# Patient Record
Sex: Male | Born: 1980 | Race: White | Hispanic: No | Marital: Single
Health system: Southern US, Community
[De-identification: ages and names within clinical notes are randomized; demographics above are authoritative.]

---

## 2003-12-06 ENCOUNTER — Ambulatory Visit (HOSPITAL_COMMUNITY): Admission: RE | Admit: 2003-12-06 | Discharge: 2003-12-06 | Payer: Self-pay | Admitting: Family Medicine

## 2005-06-03 IMAGING — US US SCROTUM
1 series · 14 of 25 positions shown · non-contrast
Comparison: none

CLINICAL DATA: Testicular pain intermittent. 
 TESTICULAR ULTRASOUND
 Testes are symmetric and normal in size, right measuring 5.3 x 2.5 x 3.8 cm and left measuring 5.2 x 2.3 x 3.3 cm.  No evidence of testicular mass.  Symmetric intratesticular blood flow on color Doppler imaging.  A very small right hydrocele is noted.   No evidence paratesticular mass.  No hernias identified.  No evidence of varicocele. 
 IMPRESSION
 Very small right hydrocele.  Otherwise, normal exam.
 ARTERIAL AND VENOUS DOPPLER OF THE SCROTUM BILATERALLY
 Color Doppler and pulsed-wave evaluation of the arterial and venous systems of the scrotum performed.  Normal arterial and venous waveforms are seen in each hemiscrotum.  Specifically, symmetric arterial blood flow is identified with no evidence of torsion or hyperemia.  
 Normal arterial and venous ultrasound of the testes/scrotum.

[Series 1: unknown · 0.09mm/px · 14 of 46 slices shown]
[im 1/46]
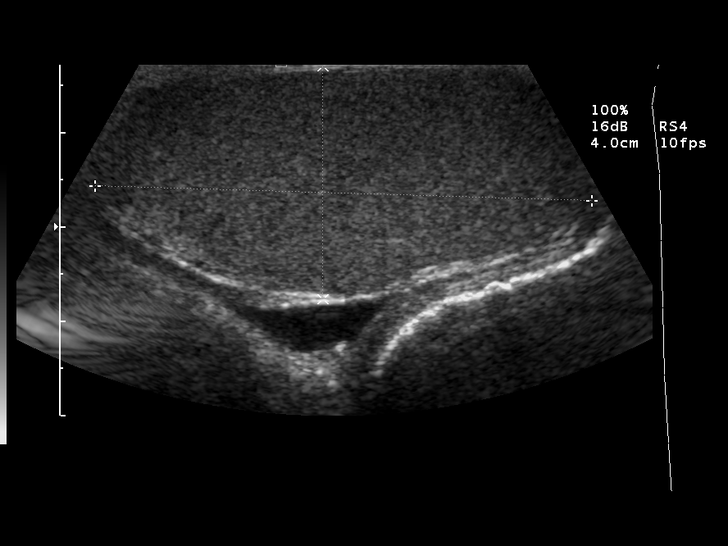
[im 4/46]
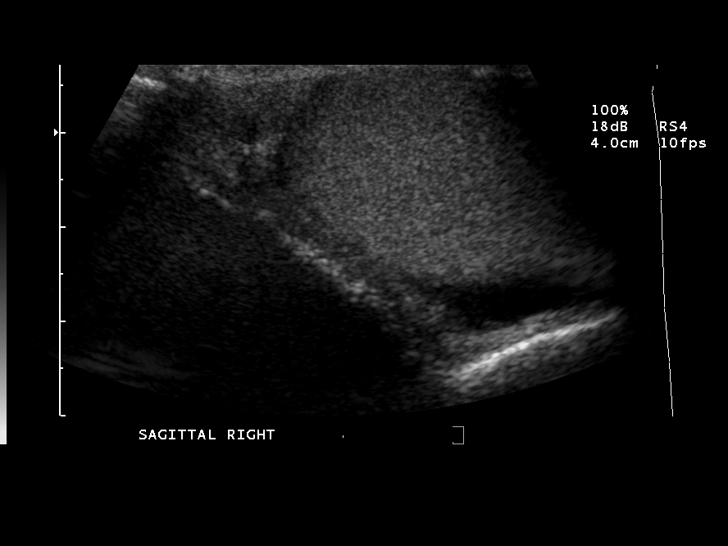
[im 8/46]
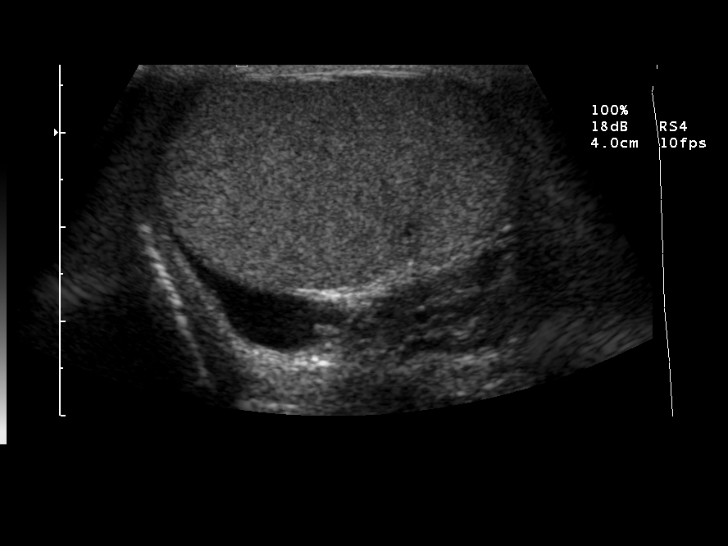
[im 12/46]
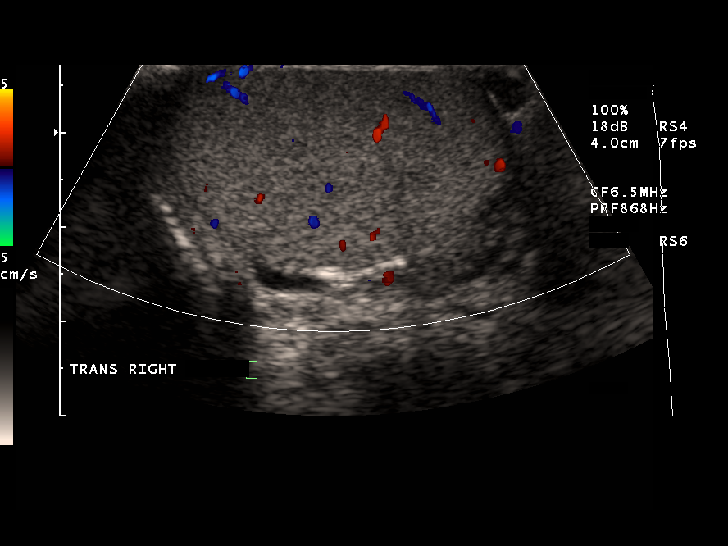
[im 16/46]
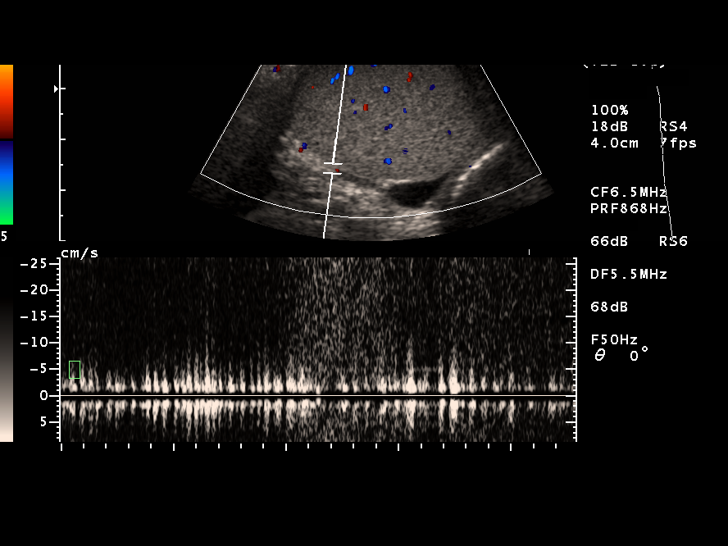
[im 17/46]
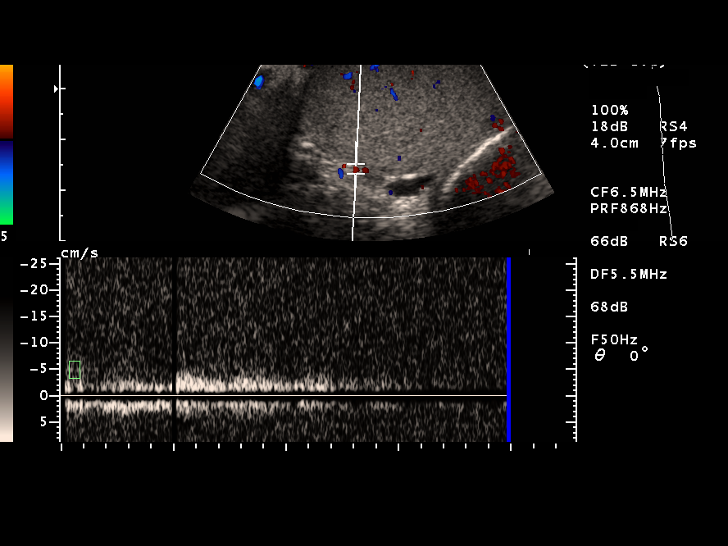
[im 21/46]
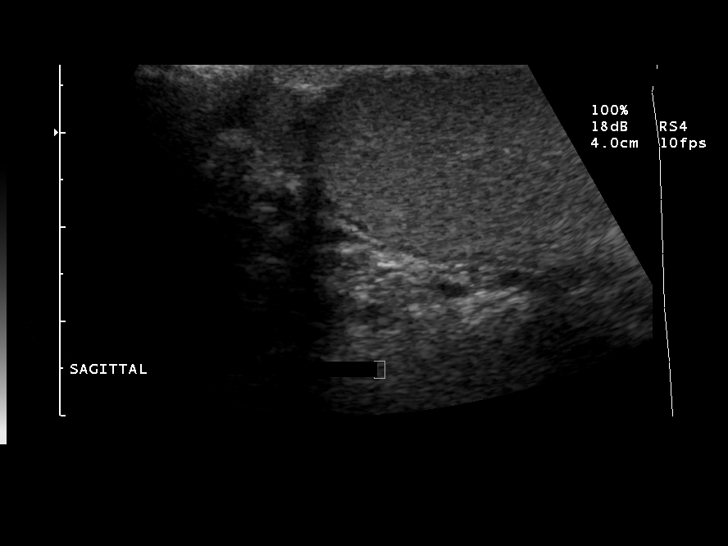
[im 25/46]
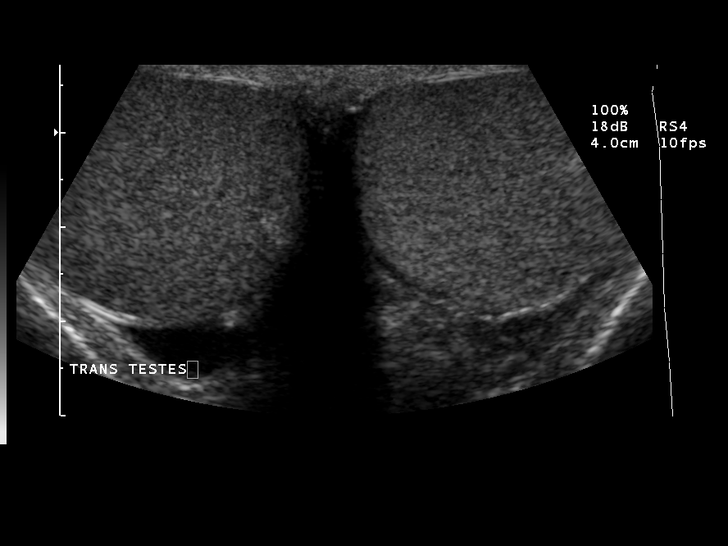
[im 29/46]
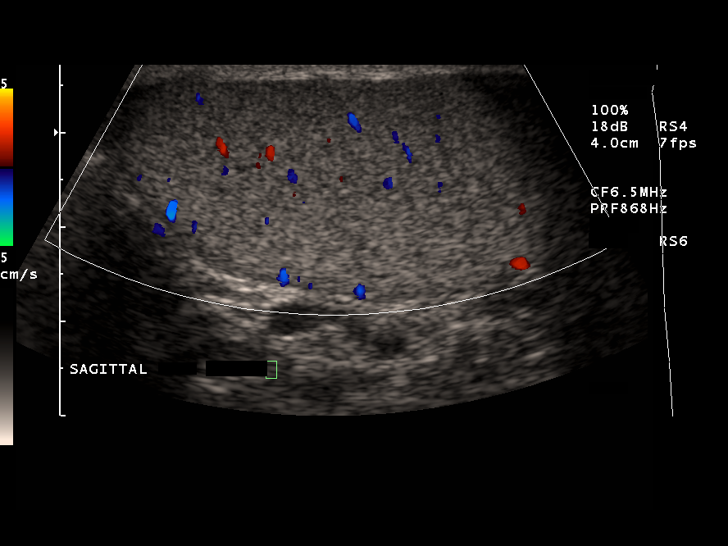
[im 31/46]
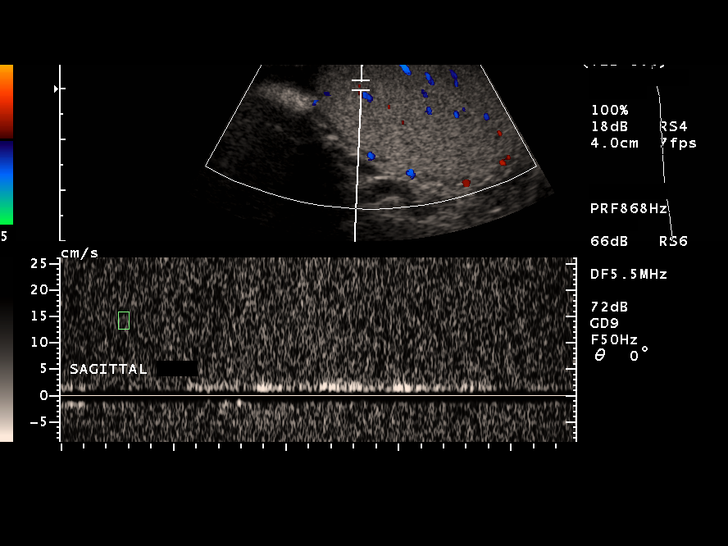
[im 34/46]
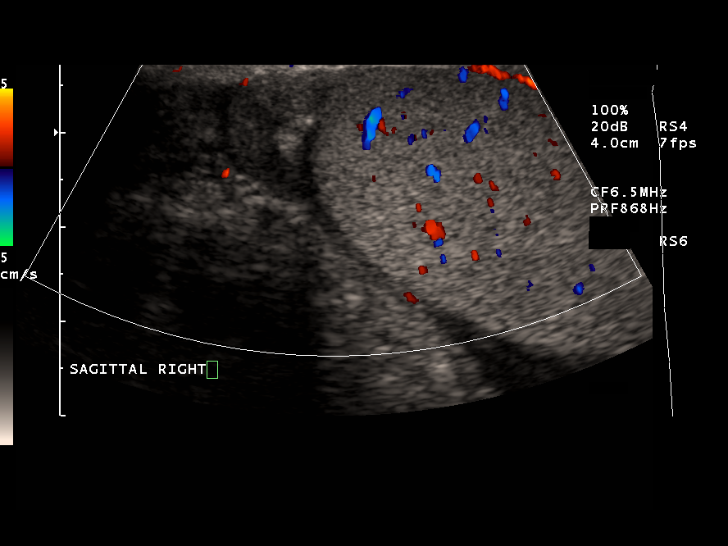
[im 38/46]
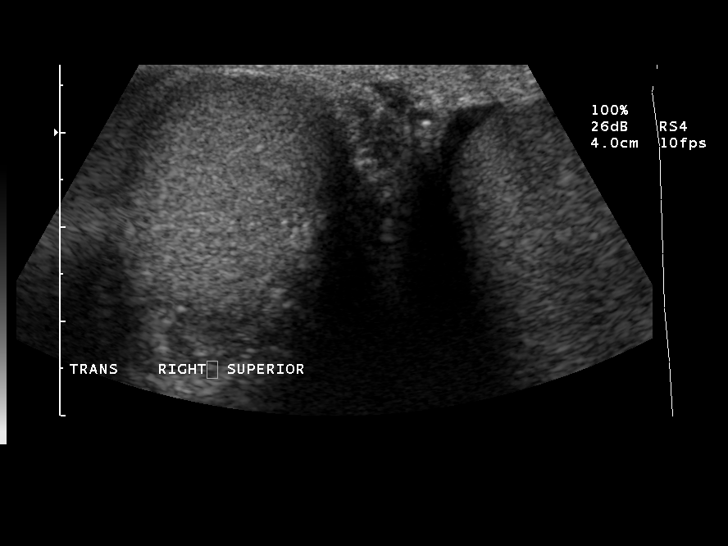
[im 42/46]
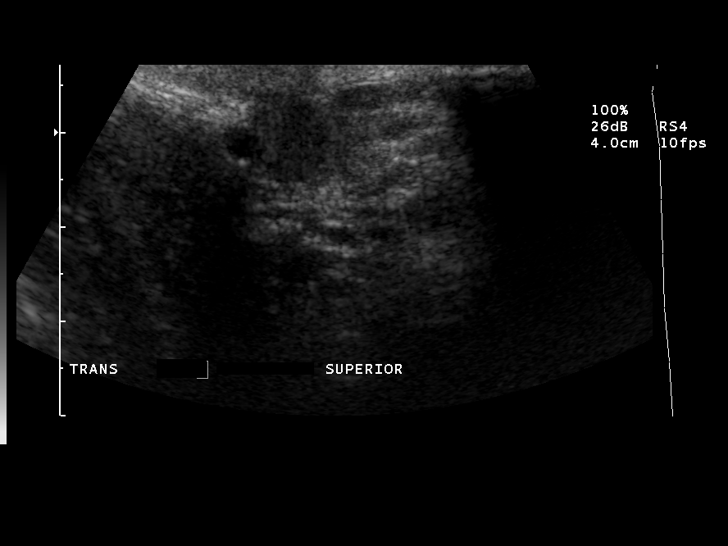
[im 46/46]
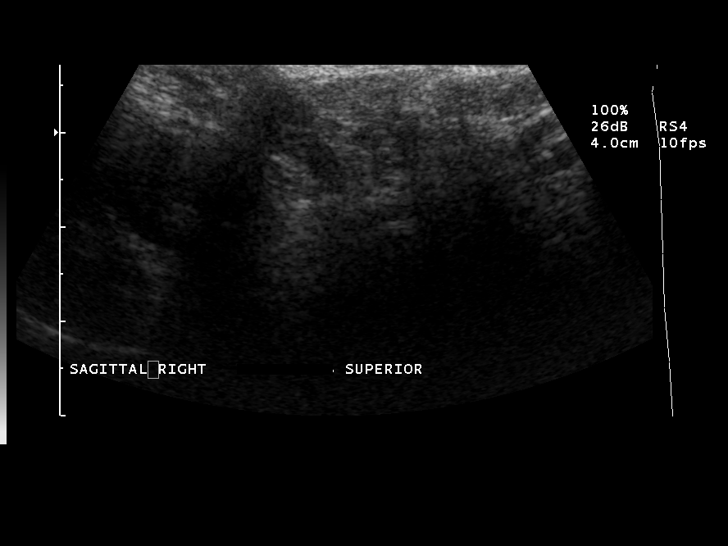

[14 of 25 positions shown; findings below may reference images not displayed]

## 2017-02-01 MED FILL — FLUoxetine HCL 40 MG CAPS: 40 | 30 days supply | Qty: 30 | Fill #0

## 2017-02-11 DIAGNOSIS — Z87442 Personal history of urinary calculi: Secondary | ICD-10-CM | POA: Diagnosis not present

## 2017-02-11 DIAGNOSIS — Z23 Encounter for immunization: Secondary | ICD-10-CM | POA: Diagnosis not present

## 2017-02-11 DIAGNOSIS — Z1389 Encounter for screening for other disorder: Secondary | ICD-10-CM | POA: Diagnosis not present

## 2017-02-11 DIAGNOSIS — Z6822 Body mass index (BMI) 22.0-22.9, adult: Secondary | ICD-10-CM | POA: Diagnosis not present

## 2017-02-11 DIAGNOSIS — Z0000 Encounter for general adult medical examination without abnormal findings: Secondary | ICD-10-CM | POA: Diagnosis not present

## 2017-02-11 DIAGNOSIS — F3289 Other specified depressive episodes: Secondary | ICD-10-CM | POA: Diagnosis not present

## 2017-03-14 MED FILL — FLUoxetine HCL 40 MG CAPS: 40 | 90 days supply | Qty: 90 | Fill #0

## 2017-04-04 DIAGNOSIS — H905 Unspecified sensorineural hearing loss: Secondary | ICD-10-CM | POA: Diagnosis not present

## 2017-06-12 DIAGNOSIS — F321 Major depressive disorder, single episode, moderate: Secondary | ICD-10-CM | POA: Diagnosis not present

## 2017-06-18 DIAGNOSIS — F321 Major depressive disorder, single episode, moderate: Secondary | ICD-10-CM | POA: Diagnosis not present

## 2017-06-27 DIAGNOSIS — F321 Major depressive disorder, single episode, moderate: Secondary | ICD-10-CM | POA: Diagnosis not present

## 2017-06-27 MED FILL — buPROPion HCL ER (XL) 150 M: 150 | 30 days supply | Qty: 30 | Fill #0

## 2017-07-05 DIAGNOSIS — F321 Major depressive disorder, single episode, moderate: Secondary | ICD-10-CM | POA: Diagnosis not present

## 2017-07-11 MED FILL — FLUoxetine HCL 40 MG CAPS: 40 | 90 days supply | Qty: 90 | Fill #0

## 2017-07-18 DIAGNOSIS — F321 Major depressive disorder, single episode, moderate: Secondary | ICD-10-CM | POA: Diagnosis not present

## 2017-07-24 DIAGNOSIS — F321 Major depressive disorder, single episode, moderate: Secondary | ICD-10-CM | POA: Diagnosis not present

## 2017-07-30 DIAGNOSIS — F321 Major depressive disorder, single episode, moderate: Secondary | ICD-10-CM | POA: Diagnosis not present

## 2017-07-30 MED FILL — buPROPion HCL ER (XL) 150 M: 150 | 30 days supply | Qty: 30 | Fill #1

## 2017-08-06 DIAGNOSIS — F321 Major depressive disorder, single episode, moderate: Secondary | ICD-10-CM | POA: Diagnosis not present

## 2017-08-13 ENCOUNTER — Ambulatory Visit: Payer: Self-pay | Admitting: Nurse Practitioner

## 2017-08-13 VITALS — BP 148/92 | HR 95 | Temp 98.2°F | Resp 20 | Wt 147.4 lb

## 2017-08-13 DIAGNOSIS — K649 Unspecified hemorrhoids: Secondary | ICD-10-CM

## 2017-08-13 MED ORDER — HYDROCORTISONE ACETATE 25 MG RE SUPP: 25.0000 mg | Freq: Two times a day (BID) | RECTAL | 1 refills | Status: AC

## 2017-08-13 MED ORDER — HYDROCORTISONE 2.5 % EX CREA: TOPICAL_CREAM | Freq: Two times a day (BID) | CUTANEOUS | 0 refills | Status: AC

## 2017-08-13 MED FILL — PROCTOZONE-HC 2.5 % CREA: 2.5 | 15 days supply | Qty: 30 | Fill #0

## 2017-08-13 NOTE — Progress Notes (Signed)
   Subjective:    Patient ID: Mario Collins, male    DOB: 01-10-1981, 37 y.o.   MRN: [id number]  The patient is a 37 y.o. Male that presents with complaints of hemorrhoids x 6 weeks.  Patient states he has had increasing rectal pain and inflammation.  Patient denies abdominal pain, fever, chills, nausea, vomiting, diarrhea or constipation.  Patient has been using OTC rectal cream for pain and itching, but symptoms have worsened over the past several days.  Patient has been using his mother's anu-cort and has found signficant relief with same.  Patient states symptoms worsen when at work and when he is moving around.  Patient has a history of hemorrhoids.   Reviewed patient's medications, past medical history and allergies.   Review of Systems  Constitutional: Negative.   HENT: Negative.   Eyes: Negative.   Respiratory: Negative.   Gastrointestinal: Positive for rectal pain. Negative for abdominal pain, blood in stool, constipation, diarrhea and vomiting.       Rectal itching  Skin: Negative.   Psychiatric/Behavioral: Negative.        Objective:   Physical Exam  Constitutional: He is oriented to person, place, and time. He appears well-developed and well-nourished. No distress.  HENT:  Head: Normocephalic and atraumatic.  Eyes: Pupils are equal, round, and reactive to light. Conjunctivae and EOM are normal.  Neck: Normal range of motion. Neck supple. No tracheal deviation present. No thyromegaly present.  Cardiovascular: Normal rate, regular rhythm and normal heart sounds.  Pulmonary/Chest: Effort normal and breath sounds normal.  Abdominal: Soft. Bowel sounds are normal. He exhibits no distension. There is no tenderness.  Genitourinary: Rectum normal.  Genitourinary Comments: Rectal irritation, internal hemorrhoids  Neurological: He is alert and oriented to person, place, and time.  Skin: Skin is warm and dry.  Psychiatric: He has a normal mood and affect. His behavior is normal.  Judgment and thought content normal.          Assessment & Plan:  Internal Hemorrhoids. 1. Patient prescribed Anu-Cort rectal suppositories and Hydrocortisone Cream 2.5%.  Patient will try cream first to see if that will provide any relief.  Patient will utilize high fiber diet.  Sitz bath to help with inflammation and swelling.  Avoid straining. Discussed with patient his thoughts on a hemorrhoidectomy, patient verbalized he is not considering that at this time.  Patient will follow up with PCP as needed.  Meds ordered this encounter  Medications  . hydrocortisone (ANUSOL-HC) 25 MG suppository    Sig: Place 1 suppository (25 mg total) rectally 2 (two) times daily for 12 days.    Dispense:  24 suppository    Refill:  1    Order Specific Question:   Supervising Provider    Answer:   Stacie GlazeJENKINS, JOHN E [[id number]]  . hydrocortisone 2.5 % cream    Sig: Apply topically 2 (two) times daily. Apply to rectum twice daily.    Dispense:  30 g    Refill:  0    Order Specific Question:   Supervising Provider    Answer:   Stacie GlazeJENKINS, JOHN E [phone][[id number]]

## 2017-08-13 NOTE — Patient Instructions (Signed)
Hemorrhoids Hemorrhoids are swollen veins in and around the rectum or anus. There are two types of hemorrhoids:  Internal hemorrhoids. These occur in the veins that are just inside the rectum. They may poke through to the outside and become irritated and painful.  External hemorrhoids. These occur in the veins that are outside of the anus and can be felt as a painful swelling or hard lump near the anus.  Most hemorrhoids do not cause serious problems, and they can be managed with home treatments such as diet and lifestyle changes. If home treatments do not help your symptoms, procedures can be done to shrink or remove the hemorrhoids. What are the causes? This condition is caused by increased pressure in the anal area. This pressure may result from various things, including:  Constipation.  Straining to have a bowel movement.  Diarrhea.  Pregnancy.  Obesity.  Sitting for long periods of time.  Heavy lifting or other activity that causes you to strain.  Anal sex.  What are the signs or symptoms? Symptoms of this condition include:  Pain.  Anal itching or irritation.  Rectal bleeding.  Leakage of stool (feces).  Anal swelling.  One or more lumps around the anus.  How is this diagnosed? This condition can often be diagnosed through a visual exam. Other exams or tests may also be done, such as:  Examination of the rectal area with a gloved hand (digital rectal exam).  Examination of the anal canal using a small tube (anoscope).  A blood test, if you have lost a significant amount of blood.  A test to look inside the colon (sigmoidoscopy or colonoscopy).  How is this treated? This condition can usually be treated at home. However, various procedures may be done if dietary changes, lifestyle changes, and other home treatments do not help your symptoms. These procedures can help make the hemorrhoids smaller or remove them completely. Some of these procedures involve  surgery, and others do not. Common procedures include:  Rubber band ligation. Rubber bands are placed at the base of the hemorrhoids to cut off the blood supply to them.  Sclerotherapy. Medicine is injected into the hemorrhoids to shrink them.  Infrared coagulation. A type of light energy is used to get rid of the hemorrhoids.  Hemorrhoidectomy surgery. The hemorrhoids are surgically removed, and the veins that supply them are tied off.  Stapled hemorrhoidopexy surgery. A circular stapling device is used to remove the hemorrhoids and use staples to cut off the blood supply to them.  Follow these instructions at home: Eating and drinking  Eat foods that have a lot of fiber in them, such as whole grains, beans, nuts, fruits, and vegetables. Ask your health care provider about taking products that have added fiber (fiber supplements).  Drink enough fluid to keep your urine clear or pale yellow. Managing pain and swelling  Take warm sitz baths for 20 minutes, 3-4 times a day to ease pain and discomfort.  If directed, apply ice to the affected area. Using ice packs between sitz baths may be helpful. ? Put ice in a plastic bag. ? Place a towel between your skin and the bag. ? Leave the ice on for 20 minutes, 2-3 times a day. General instructions  Take over-the-counter and prescription medicines only as told by your health care provider.  Use medicated creams or suppositories as told.  Exercise regularly.  Go to the bathroom when you have the urge to have a bowel movement. Do not wait.    Avoid straining to have bowel movements.  Keep the anal area dry and clean. Use wet toilet paper or moist towelettes after a bowel movement.  Do not sit on the toilet for long periods of time. This increases blood pooling and pain. Contact a health care provider if:  You have increasing pain and swelling that are not controlled by treatment or medicine.  You have uncontrolled bleeding.  You  have difficulty having a bowel movement, or you are unable to have a bowel movement.  You have pain or inflammation outside the area of the hemorrhoids. This information is not intended to replace advice given to you by your health care provider. Make sure you discuss any questions you have with your health care provider. Document Released: 05/04/2000 Document Revised: 10/05/2015 Document Reviewed: 01/19/2015 Elsevier Interactive Patient Education  2018 [employer]  

## 2017-08-14 DIAGNOSIS — F321 Major depressive disorder, single episode, moderate: Secondary | ICD-10-CM | POA: Diagnosis not present

## 2017-08-15 ENCOUNTER — Telehealth: Payer: Self-pay | Admitting: Emergency Medicine

## 2017-08-15 NOTE — Telephone Encounter (Signed)
Spoke with patient since his visit with [facility], he stated he is doing much better, the cream is really helping. However, the suppositories are very expensive  they were $90 and he was not able to get them but stated since the cream is working he was doing ok.

## 2017-08-29 DIAGNOSIS — F321 Major depressive disorder, single episode, moderate: Secondary | ICD-10-CM | POA: Diagnosis not present

## 2017-09-02 MED FILL — buPROPion HCL ER (XL) 150 M: 150 | 30 days supply | Qty: 30 | Fill #2

## 2017-09-12 DIAGNOSIS — F321 Major depressive disorder, single episode, moderate: Secondary | ICD-10-CM | POA: Diagnosis not present

## 2017-09-26 DIAGNOSIS — F321 Major depressive disorder, single episode, moderate: Secondary | ICD-10-CM | POA: Diagnosis not present

## 2017-10-03 MED FILL — BUPROPION HCL XL 150 MG TAB: 150 | 30 days supply | Qty: 30 | Fill #3

## 2017-10-15 MED FILL — FLUoxetine HCL 40 MG CAPS: 40 | 90 days supply | Qty: 90 | Fill #1

## 2017-10-23 DIAGNOSIS — F321 Major depressive disorder, single episode, moderate: Secondary | ICD-10-CM | POA: Diagnosis not present

## 2017-11-05 MED FILL — BUPROPION HCL XL 150 MG TAB: 150 | 30 days supply | Qty: 30 | Fill #4

## 2017-11-17 ENCOUNTER — Ambulatory Visit (INDEPENDENT_AMBULATORY_CARE_PROVIDER_SITE_OTHER): Payer: Self-pay | Admitting: Family Medicine

## 2017-11-17 VITALS — BP 138/86 | HR 89 | Temp 98.6°F | Resp 16 | Wt 144.6 lb

## 2017-11-17 DIAGNOSIS — Z0000 Encounter for general adult medical examination without abnormal findings: Secondary | ICD-10-CM

## 2017-11-17 DIAGNOSIS — K649 Unspecified hemorrhoids: Secondary | ICD-10-CM

## 2017-11-17 MED ORDER — HYDROCORTISONE 2.5 % RE CREA: 1.0000 "application " | TOPICAL_CREAM | Freq: Two times a day (BID) | RECTAL | 0 refills | Status: AC

## 2017-11-17 NOTE — Progress Notes (Signed)
Mario Collins is a 37 y.o. male who presents today with concerns of An employment physical exam. She has no pcp -and is requesting medication refill.  Review of Systems  Constitutional: Negative for chills, fever and malaise/fatigue.  HENT: Negative for congestion, ear discharge, ear pain, sinus pain and sore throat.   Eyes: Negative.   Respiratory: Negative for cough, sputum production and shortness of breath.   Cardiovascular: Negative.  Negative for chest pain.  Gastrointestinal: Negative for abdominal pain, diarrhea, nausea and vomiting.  Genitourinary: Negative for dysuria, frequency, hematuria and urgency.  Musculoskeletal: Negative for myalgias.  Skin: Negative.   Neurological: Negative for headaches.  Endo/Heme/Allergies: Negative.   Psychiatric/Behavioral: Negative.     O: Vitals:   11/17/17 1208  BP: 138/86  Pulse: 89  Resp: 16  Temp: 98.6 F (37 C)  SpO2: 99%     Physical Exam  Constitutional: He is oriented to person, place, and time. Vital signs are normal. He appears well-developed and well-nourished. He is active.  Non-toxic appearance. He does not have a sickly appearance.  HENT:  Head: Normocephalic.  Right Ear: Hearing, tympanic membrane, external ear and ear canal normal.  Left Ear: Hearing, tympanic membrane, external ear and ear canal normal.  Nose: Nose normal.  Mouth/Throat: Uvula is midline and oropharynx is clear and moist.  Bilateral hearing aids  Neck: Normal range of motion. Neck supple.  Cardiovascular: Normal rate, regular rhythm, normal heart sounds and normal pulses.  Pulmonary/Chest: Effort normal and breath sounds normal.  Abdominal: Soft. Bowel sounds are normal.  Musculoskeletal: Normal range of motion.  Lymphadenopathy:       Head (right side): No submental and no submandibular adenopathy present.       Head (left side): No submental and no submandibular adenopathy present.    He has no cervical adenopathy.  Neurological: He is  alert and oriented to person, place, and time.  Psychiatric: He has a normal mood and affect. His speech is normal and behavior is normal. Cognition and memory are normal.  PHQ-9-negative  Vitals reviewed.  A: 1. Physical exam   2. Hemorrhoids, unspecified hemorrhoid type    P: Exam findings, diagnosis etiology and medication use and indications reviewed with patient. Follow- Up and discharge instructions provided. No emergent/urgent issues found on exam.  Patient verbalized understanding of information provided and agrees with plan of care (POC), all questions answered.  1. Physical exam  WNL 2. Hemorrhoids, unspecified hemorrhoid type - hydrocortisone (ANUSOL-HC) 2.5 % rectal cream; Place 1 application rectally 2 (two) times daily.

## 2017-11-17 NOTE — Patient Instructions (Signed)

## 2017-11-17 NOTE — Progress Notes (Signed)
Patient wears hearing aids and is unable to hear any of the hearing screening results, notified provider

## 2017-11-18 MED FILL — PROCTOZONE-HC 2.5 % CREA: 2.5 | 15 days supply | Qty: 30 | Fill #0

## 2017-11-19 DIAGNOSIS — F321 Major depressive disorder, single episode, moderate: Secondary | ICD-10-CM | POA: Diagnosis not present

## 2017-12-06 MED FILL — buPROPion HCL ER (XL) 150 M: 150 | 30 days supply | Qty: 30 | Fill #5

## 2018-01-07 MED FILL — buPROPion HCL ER (XL) 150 M: 150 | 30 days supply | Qty: 30 | Fill #0

## 2018-01-14 DIAGNOSIS — F321 Major depressive disorder, single episode, moderate: Secondary | ICD-10-CM | POA: Diagnosis not present

## 2018-01-15 MED FILL — FLUoxetine HCL 40 MG CAPS: 40 | 90 days supply | Qty: 90 | Fill #2

## 2018-02-06 MED FILL — buPROPion HCL ER (XL) 150 M: 150 | 30 days supply | Qty: 30 | Fill #1

## 2018-02-24 DIAGNOSIS — F329 Major depressive disorder, single episode, unspecified: Secondary | ICD-10-CM | POA: Diagnosis not present

## 2018-02-24 DIAGNOSIS — D751 Secondary polycythemia: Secondary | ICD-10-CM | POA: Diagnosis not present

## 2018-02-24 DIAGNOSIS — Z6822 Body mass index (BMI) 22.0-22.9, adult: Secondary | ICD-10-CM | POA: Diagnosis not present

## 2018-02-24 DIAGNOSIS — E7849 Other hyperlipidemia: Secondary | ICD-10-CM | POA: Diagnosis not present

## 2018-02-24 DIAGNOSIS — Z87442 Personal history of urinary calculi: Secondary | ICD-10-CM | POA: Diagnosis not present

## 2018-02-25 MED FILL — PROCTOZONE-HC 2.5 % CREA: 2.5 | 14 days supply | Qty: 30 | Fill #0

## 2018-03-07 MED FILL — buPROPion HCL ER (XL) 150 M: 150 | 30 days supply | Qty: 30 | Fill #2

## 2018-04-07 MED FILL — buPROPion HCL ER (XL) 150 M: 150 | 30 days supply | Qty: 30 | Fill #3

## 2018-04-15 MED FILL — FLUoxetine HCL 40 MG CAPS: 40 | 90 days supply | Qty: 90 | Fill #3

## 2018-04-19 ENCOUNTER — Ambulatory Visit: Payer: Self-pay | Admitting: Family Medicine

## 2018-04-19 VITALS — BP 110/70 | HR 100 | Temp 98.8°F | Resp 16 | Wt 146.6 lb

## 2018-04-19 DIAGNOSIS — K529 Noninfective gastroenteritis and colitis, unspecified: Secondary | ICD-10-CM

## 2018-04-19 MED ORDER — ONDANSETRON HCL 4 MG PO TABS: 4.0000 mg | ORAL_TABLET | Freq: Three times a day (TID) | ORAL | 0 refills | Status: AC | PRN

## 2018-04-19 NOTE — Progress Notes (Signed)
Mario Collins is a 37 y.o. male who presents today with concerns of diarrhea for the last 2 mornings. He reports eating out with family and friends on Thursday and that no one else is unwell or has any GI concerns. He reports eating a salad (denies romaine lettuce) which is not something that he usually eats and eating a pecan pie which no one else in his party ate. 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 He reports that he felt like he experienced a fever last night and took tylenol at 230 AM and he feels like "fever broke" he reports waking up to more diarrhea. He reports eating one meal of fast food for the last 2 days.    Review of Systems  Constitutional: Positive for fever. Negative for chills and malaise/fatigue.  HENT: Negative for congestion, ear discharge, ear pain, sinus pain and sore throat.   Eyes: Negative.   Respiratory: Negative for cough, sputum production and shortness of breath.   Cardiovascular: Negative.  Negative for chest pain.  Gastrointestinal: Positive for diarrhea and nausea. Negative for abdominal pain, blood in stool, constipation, heartburn and vomiting.  Genitourinary: Negative for dysuria, frequency, hematuria and urgency.  Musculoskeletal: Negative for back pain, myalgias and neck pain.  Skin: Negative.   Neurological: Positive for dizziness. Negative for headaches.  Endo/Heme/Allergies: Negative.   Psychiatric/Behavioral: Negative.     O: Vitals:   04/19/18 1047  BP: 110/70  Pulse: 100  Resp: 16  Temp: 98.8 F (37.1 C)  SpO2: 99%     Physical Exam  Constitutional: He is oriented to person, place, and time. Vital signs are normal. He appears well-developed and well-nourished. He is active.  Non-toxic appearance. He does not have a sickly appearance.  HENT:  Head: Normocephalic.  Right Ear: Hearing, tympanic membrane, external ear and ear canal normal.  Left Ear: Hearing, tympanic membrane, external ear and  ear canal normal.  Nose: Nose normal.  Mouth/Throat: Uvula is midline and oropharynx is clear and moist.  Neck: Normal range of motion. Neck supple.  Cardiovascular: Normal rate, regular rhythm, normal heart sounds and normal pulses.  Pulmonary/Chest: Effort normal and breath sounds normal.  Abdominal: Soft. Bowel sounds are increased. There is no tenderness. There is no rigidity, no rebound, no guarding, no CVA tenderness, no tenderness at McBurney's point and negative Murphy's sign.  Musculoskeletal: Normal range of motion.  Lymphadenopathy:       Head (right side): No submental, no submandibular and no tonsillar adenopathy present.       Head (left side): No submental, no submandibular and no tonsillar adenopathy present.    He has no cervical adenopathy.       Right cervical: No superficial cervical adenopathy present.      Left cervical: No superficial cervical adenopathy present.  Neurological: He is alert and oriented to person, place, and time. No cranial nerve deficit or sensory deficit.  Psychiatric: He has a normal mood and affect.  Vitals reviewed.  A: 1. Gastroenteritis    P: Discussed exam findings, diagnosis etiology and medication use and indications reviewed with patient. Follow- Up and discharge instructions provided. No emergent/urgent issues found on exam.  Patient verbalized understanding of information provided and agrees with plan of care (POC), all questions answered.  1. Gastroenteritis - ondansetron (ZOFRAN) 4 MG tablet; Take 1 tablet (4 mg total) by mouth every 8 (eight) hours as needed for nausea or vomiting.  No acute findings on  exam overall exam unremarkable. Work note x 48 hours, since symptoms are limted to to loose stool only in the AM and patient is able to eat small amounts with no abdominal pain- he is advised to increase hydration, and follow diet to bulk stool, use medication provided today to decrease nausea so paient can consume a full meal.  Discussed signs of symptoms of acute abdomen and advised that if symptoms persisted beyond 72 hours to seek comprehensive evaluation and treatment.

## 2018-04-19 NOTE — Patient Instructions (Signed)
Viral Gastroenteritis, Adult  Viral gastroenteritis is also known as the stomach flu. This condition is caused by certain germs (viruses). These germs can be passed from person to person very easily (are very contagious). This condition can cause sudden watery poop (diarrhea), fever, and throwing up (vomiting).  Having watery poop and throwing up can make you feel weak and cause you to get dehydrated. Dehydration can make you tired and thirsty, make you have a dry mouth, and make it so you pee (urinate) less often. Older adults and people with other diseases or a weak defense system (immune system) are at higher risk for dehydration. It is important to replace the fluids that you lose from having watery poop and throwing up.  Follow these instructions at home:  Follow instructions from your doctor about how to care for yourself at home.  Eating and drinking    Follow these instructions as told by your doctor:   Take an oral rehydration solution (ORS). This is a drink that is sold at pharmacies and stores.   Drink clear fluids in small amounts as you are able, such as:  ? Water.  ? Ice chips.  ? Diluted fruit juice.  ? Low-calorie sports drinks.   Eat bland, easy-to-digest foods in small amounts as you are able, such as:  ? Bananas.  ? Applesauce.  ? Rice.  ? Low-fat (lean) meats.  ? Toast.  ? Crackers.   Avoid fluids that have a lot of sugar or caffeine in them.   Avoid alcohol.   Avoid spicy or fatty foods.    General instructions   Drink enough fluid to keep your pee (urine) clear or pale yellow.   Wash your hands often. If you cannot use soap and water, use hand sanitizer.   Make sure that all people in your home wash their hands well and often.   Rest at home while you get better.   Take over-the-counter and prescription medicines only as told by your doctor.   Watch your condition for any changes.   Take a warm bath to help with any burning or pain from having watery poop.   Keep all follow-up  visits as told by your doctor. This is important.  Contact a doctor if:   You cannot keep fluids down.   Your symptoms get worse.   You have new symptoms.   You feel light-headed or dizzy.   You have muscle cramps.  Get help right away if:   You have chest pain.   You feel very weak or you pass out (faint).   You see blood in your throw-up.   Your throw-up looks like coffee grounds.   You have bloody or black poop (stools) or poop that look like tar.   You have a very bad headache, a stiff neck, or both.   You have a rash.   You have very bad pain, cramping, or bloating in your belly (abdomen).   You have trouble breathing.   You are breathing very quickly.   Your heart is beating very quickly.   Your skin feels cold and clammy.   You feel confused.   You have pain when you pee.   You have signs of dehydration, such as:  ? Dark pee, hardly any pee, or no pee.  ? Cracked lips.  ? Dry mouth.  ? Sunken eyes.  ? Sleepiness.  ? Weakness.  This information is not intended to replace advice given to you by your   health care provider. Make sure you discuss any questions you have with your health care provider.  Document Released: 10/24/2007 Document Revised: 11/25/2015 Document Reviewed: 01/11/2015  [employer]vier Interactive Patient Education  2017 [employer]        Food Choices to Help Relieve Diarrhea, Adult  When you have diarrhea, the foods you eat and your eating habits are very important. Choosing the right foods and drinks can help:   Relieve diarrhea.   Replace lost fluids and nutrients.   Prevent dehydration.    What general guidelines should I follow?  Relieving diarrhea   Choose foods with less than 2 g or .07 oz. of fiber per serving.   Limit fats to less than 8 tsp (38 g or 1.34 oz.) a day.   Avoid the following:  ? Foods and beverages sweetened with high-fructose corn syrup, honey, or sugar alcohols such as xylitol, sorbitol, and mannitol.  ? Foods that contain a lot of fat or  sugar.  ? Fried, greasy, or spicy foods.  ? High-fiber grains, breads, and cereals.  ? Raw fruits and vegetables.   Eat foods that are rich in probiotics. These foods include dairy products such as yogurt and fermented milk products. They help increase healthy bacteria in the stomach and intestines (gastrointestinal tract, or GI tract).   If you have lactose intolerance, avoid dairy products. These may make your diarrhea worse.   Take medicine to help stop diarrhea (antidiarrheal medicine) only as told by your health care provider.  Replacing nutrients   Eat small meals or snacks every 3-4 hours.   Eat bland foods, such as white rice, toast, or baked potato, until your diarrhea starts to get better. Gradually reintroduce nutrient-rich foods as tolerated or as told by your health care provider. This includes:  ? Well-cooked protein foods.  ? Peeled, seeded, and soft-cooked fruits and vegetables.  ? Low-fat dairy products.   Take vitamin and mineral supplements as told by your health care provider.  Preventing dehydration     Start by sipping water or a special solution to prevent dehydration (oral rehydration solution, ORS). Urine that is clear or pale yellow means that you are getting enough fluid.   Try to drink at least 8-10 cups of fluid each day to help replace lost fluids.   You may add other liquids in addition to water, such as clear juice or decaffeinated sports drinks, as tolerated or as told by your health care provider.   Avoid drinks with caffeine, such as coffee, tea, or soft drinks.   Avoid alcohol.  What foods are recommended?  The items listed may not be a complete list. Talk with your health care provider about what dietary choices are best for you.  Grains  White rice. White, French, or pita breads (fresh or toasted), including plain rolls, buns, or bagels. White pasta. Saltine, soda, or Mario Collins crackers. Pretzels. Low-fiber cereal. Cooked cereals made with water (such as cornmeal,  farina, or cream cereals). Plain muffins. Matzo. Melba toast. Zwieback.  Vegetables  Potatoes (without the skin). Most well-cooked and canned vegetables without skins or seeds. Tender lettuce.  Fruits  Apple sauce. Fruits canned in juice. Cooked apricots, cherries, grapefruit, peaches, pears, or plums. Fresh bananas and cantaloupe.  Meats and other protein foods  Baked or boiled chicken. Eggs. Tofu. Fish. Seafood. Smooth nut butters. Ground or well-cooked tender beef, ham, veal, lamb, pork, or poultry.  Dairy  Plain yogurt, kefir, and unsweetened liquid yogurt. Lactose-free milk, buttermilk, skim   milk, or soy milk. Low-fat or nonfat hard cheese.  Beverages  Water. Low-calorie sports drinks. Fruit juices without pulp. Strained tomato and vegetable juices. Decaffeinated teas. Sugar-free beverages not sweetened with sugar alcohols. Oral rehydration solutions, if approved by your health care provider.  Seasoning and other foods  Bouillon, broth, or soups made from recommended foods.  What foods are not recommended?  The items listed may not be a complete list. Talk with your health care provider about what dietary choices are best for you.  Grains  Whole grain, whole wheat, bran, or rye breads, rolls, pastas, and crackers. Wild or brown rice. Whole grain or bran cereals. Barley. Oats and oatmeal. Corn tortillas or taco shells. Granola. Popcorn.  Vegetables  Raw vegetables. Fried vegetables. Cabbage, broccoli, Brussels sprouts, artichokes, baked beans, beet greens, corn, kale, legumes, peas, sweet potatoes, and yams. Potato skins. Cooked spinach and cabbage.  Fruits  Dried fruit, including raisins and dates. Raw fruits. Stewed or dried prunes. Canned fruits with syrup.  Meat and other protein foods  Fried or fatty meats. Deli meats. Chunky nut butters. Nuts and seeds. Beans and lentils. Bacon. Hot dogs. Sausage.  Dairy  High-fat cheeses. Whole milk, chocolate milk, and beverages made with milk, such as milk shakes.  Half-and-half. Cream. sour cream. Ice cream.  Beverages  Caffeinated beverages (such as coffee, tea, soda, or energy drinks). Alcoholic beverages. Fruit juices with pulp. Prune juice. Soft drinks sweetened with high-fructose corn syrup or sugar alcohols. High-calorie sports drinks.  Fats and oils  Butter. Cream sauces. Margarine. Salad oils. Plain salad dressings. Olives. Avocados. Mayonnaise.  Sweets and desserts  Sweet rolls, doughnuts, and sweet breads. Sugar-free desserts sweetened with sugar alcohols such as xylitol and sorbitol.  Seasoning and other foods  Honey. Hot sauce. Chili powder. Gravy. Cream-based or milk-based soups. Pancakes and waffles.  Summary   When you have diarrhea, the foods you eat and your eating habits are very important.   Make sure you get at least 8-10 cups of fluid each day, or enough to keep your urine clear or pale yellow.   Eat bland foods and gradually reintroduce healthy, nutrient-rich foods as tolerated, or as told by your health care provider.   Avoid high-fiber, fried, greasy, or spicy foods.  This information is not intended to replace advice given to you by your health care provider. Make sure you discuss any questions you have with your health care provider.  Document Released: 07/28/2003 Document Revised: 05/04/2016 Document Reviewed: 05/04/2016  [employer]vier Interactive Patient Education  2018 [employer]

## 2018-04-21 ENCOUNTER — Telehealth: Payer: Self-pay

## 2018-04-21 NOTE — Telephone Encounter (Signed)
Courtesy call - LMOVM  In detail advising this was a courtesy call to see how he was feeling. Advised to follow up with his PCP or contact our office as needed if no better.

## 2018-05-09 MED FILL — buPROPion HCL ER (XL) 150 M: 150 | 30 days supply | Qty: 30 | Fill #4

## 2018-06-02 MED FILL — buPROPion HCL ER (XL) 150 M: 150 | 30 days supply | Qty: 30 | Fill #5

## 2018-07-04 MED FILL — buPROPion HCL ER (XL) 150 M: 150 | 30 days supply | Qty: 30 | Fill #6

## 2018-07-25 MED FILL — FLUoxetine HCL 40 MG CAPS: 40 | 90 days supply | Qty: 90 | Fill #0

## 2018-08-04 MED FILL — buPROPion HCL ER (XL) 150 M: 150 | 30 days supply | Qty: 30 | Fill #7 | Status: TO

## 2018-08-04 MED FILL — PROCTOZONE-HC 2.5 % CREA: 2.5 | 14 days supply | Qty: 30 | Fill #1

## 2018-09-04 MED FILL — buPROPion HCL ER (XL) 150 M: 150 | 30 days supply | Qty: 30 | Fill #0

## 2018-10-03 MED FILL — buPROPion HCL ER (XL) 150 M: 150 | 30 days supply | Qty: 30 | Fill #1

## 2018-10-27 MED FILL — FLUoxetine HCL 40 MG CAPS: 40 | 90 days supply | Qty: 90 | Fill #0

## 2018-11-06 MED FILL — buPROPion HCL ER (XL) 150 M: 150 | 30 days supply | Qty: 30 | Fill #0

## 2018-12-01 MED FILL — PROCTOZONE-HC 2.5 % CREA: 2.5 | 14 days supply | Qty: 30 | Fill #2

## 2018-12-01 MED FILL — buPROPion HCL ER (XL) 150 M: 150 | 30 days supply | Qty: 30 | Fill #1

## 2018-12-31 MED FILL — buPROPion HCL ER (XL) 150 M: 150 | 30 days supply | Qty: 30 | Fill #2

## 2019-01-27 MED FILL — FLUoxetine HCL 40 MG CAPS: 40 | 90 days supply | Qty: 90 | Fill #1

## 2019-02-02 MED FILL — buPROPion HCL ER (XL) 150 M: 150 | 30 days supply | Qty: 30 | Fill #0

## 2019-02-18 MED FILL — PROCTOZONE-HC 2.5 % CREA: 2.5 | 14 days supply | Qty: 30 | Fill #0

## 2019-03-05 DIAGNOSIS — D751 Secondary polycythemia: Secondary | ICD-10-CM | POA: Diagnosis not present

## 2019-03-05 DIAGNOSIS — F329 Major depressive disorder, single episode, unspecified: Secondary | ICD-10-CM | POA: Diagnosis not present

## 2019-03-05 DIAGNOSIS — Z1331 Encounter for screening for depression: Secondary | ICD-10-CM | POA: Diagnosis not present

## 2019-03-05 DIAGNOSIS — Z87442 Personal history of urinary calculi: Secondary | ICD-10-CM | POA: Diagnosis not present

## 2019-03-05 DIAGNOSIS — E785 Hyperlipidemia, unspecified: Secondary | ICD-10-CM | POA: Diagnosis not present

## 2019-03-06 MED FILL — buPROPion HCL ER (XL) 150 M: 150 | 30 days supply | Qty: 30 | Fill #1

## 2019-04-07 MED FILL — buPROPion HCL ER (XL) 150 M: 150 | 30 days supply | Qty: 30 | Fill #2

## 2019-05-04 MED FILL — FLUoxetine HCL 40 MG CAPS: 40 | 90 days supply | Qty: 90 | Fill #2

## 2019-05-08 MED FILL — buPROPion HCL ER (XL) 150 M: 150 | 30 days supply | Qty: 30 | Fill #3

## 2019-05-08 MED FILL — PROCTOZONE-HC 2.5 % CREA: 2.5 | 14 days supply | Qty: 30 | Fill #1

## 2019-06-08 MED FILL — buPROPion HCL ER (XL) 150 M: 150 | 30 days supply | Qty: 30 | Fill #4

## 2019-07-10 MED FILL — buPROPion HCL ER (XL) 150 M: 150 | 30 days supply | Qty: 30 | Fill #5

## 2019-08-03 MED FILL — FLUoxetine HCL 40 MG CAPS: 40 | 90 days supply | Qty: 90 | Fill #3

## 2019-08-10 MED FILL — buPROPion HCL ER (XL) 150 M: 150 | 30 days supply | Qty: 30 | Fill #6

## 2019-09-09 MED FILL — PROCTOZONE-HC 2.5 % CREA: 2.5 | 14 days supply | Qty: 30 | Fill #2

## 2019-09-09 MED FILL — buPROPion HCL ER (XL) 150 M: 150 | 30 days supply | Qty: 30 | Fill #7

## 2019-10-12 MED FILL — buPROPion HCL ER (XL) 150 M: 150 | 30 days supply | Qty: 30 | Fill #8

## 2019-11-03 ENCOUNTER — Other Ambulatory Visit (HOSPITAL_COMMUNITY): Payer: Self-pay | Admitting: Psychiatry

## 2019-11-03 MED FILL — FLUoxetine HCL 40 MG CAPS: 40 | 90 days supply | Qty: 90 | Fill #0

## 2019-11-10 MED FILL — buPROPion HCL ER (XL) 150 M: 150 | 30 days supply | Qty: 30 | Fill #9

## 2019-12-10 MED FILL — buPROPion HCL ER (XL) 150 M: 150 | 30 days supply | Qty: 30 | Fill #10

## 2019-12-11 ENCOUNTER — Other Ambulatory Visit (HOSPITAL_COMMUNITY): Payer: Self-pay | Admitting: Endocrinology

## 2019-12-11 MED FILL — PROCTOZONE-HC 2.5 % CREA: 2.5 | 14 days supply | Qty: 30 | Fill #0

## 2020-01-11 MED FILL — buPROPion HCL ER (XL) 150 M: 150 | 30 days supply | Qty: 30 | Fill #11

## 2020-02-01 MED FILL — FLUoxetine HCL 40 MG CAPS: 40 | 90 days supply | Qty: 90 | Fill #1

## 2020-02-09 ENCOUNTER — Other Ambulatory Visit (HOSPITAL_COMMUNITY): Payer: Self-pay | Admitting: Psychiatry

## 2020-02-09 MED FILL — buPROPion HCL ER (XL) 150 M: 150 | 30 days supply | Qty: 30 | Fill #0

## 2020-02-26 ENCOUNTER — Other Ambulatory Visit (HOSPITAL_COMMUNITY): Payer: Self-pay | Admitting: Registered Nurse

## 2020-02-26 DIAGNOSIS — J069 Acute upper respiratory infection, unspecified: Secondary | ICD-10-CM | POA: Diagnosis not present

## 2020-02-26 DIAGNOSIS — Z1152 Encounter for screening for COVID-19: Secondary | ICD-10-CM | POA: Diagnosis not present

## 2020-02-26 DIAGNOSIS — R0981 Nasal congestion: Secondary | ICD-10-CM | POA: Diagnosis not present

## 2020-02-26 DIAGNOSIS — R059 Cough, unspecified: Secondary | ICD-10-CM | POA: Diagnosis not present

## 2020-02-26 DIAGNOSIS — R509 Fever, unspecified: Secondary | ICD-10-CM | POA: Diagnosis not present

## 2020-02-26 MED FILL — AMOX-CLAV 875-125 MG TABLET: 875-125 | 7 days supply | Qty: 14 | Fill #0

## 2020-03-11 MED FILL — buPROPion HCL ER (XL) 150 M: 150 | 30 days supply | Qty: 30 | Fill #1

## 2020-03-11 MED FILL — PROCTOZONE-HC 2.5 % CREA: 2.5 | 14 days supply | Qty: 30 | Fill #1

## 2020-03-29 ENCOUNTER — Ambulatory Visit: Payer: Self-pay | Attending: Internal Medicine

## 2020-03-29 DIAGNOSIS — Z23 Encounter for immunization: Secondary | ICD-10-CM

## 2020-03-29 NOTE — Progress Notes (Signed)
   [facility]  Name:  Mario Collins    MRN: [id number] DOB: 02/24/1981  03/29/2020  Mr. Mario Collins was observed post Covid-19 immunization for 15 minutes without incident. He was provided with Vaccine Information Sheet and instruction to access the V-Safe system.   Mr. Mario Collins was instructed to call 911 with any severe reactions post vaccine: Marland Kitchen Difficulty breathing  . Swelling of face and throat  . A fast heartbeat  . A bad rash all over body  . Dizziness and weakness

## 2020-04-13 DIAGNOSIS — R635 Abnormal weight gain: Secondary | ICD-10-CM | POA: Diagnosis not present

## 2020-04-13 DIAGNOSIS — E782 Mixed hyperlipidemia: Secondary | ICD-10-CM | POA: Diagnosis not present

## 2020-04-13 DIAGNOSIS — E785 Hyperlipidemia, unspecified: Secondary | ICD-10-CM | POA: Diagnosis not present

## 2020-04-13 DIAGNOSIS — N401 Enlarged prostate with lower urinary tract symptoms: Secondary | ICD-10-CM | POA: Diagnosis not present

## 2020-04-13 DIAGNOSIS — Z87442 Personal history of urinary calculi: Secondary | ICD-10-CM | POA: Diagnosis not present

## 2020-04-13 DIAGNOSIS — D751 Secondary polycythemia: Secondary | ICD-10-CM | POA: Diagnosis not present

## 2020-04-13 DIAGNOSIS — Z0000 Encounter for general adult medical examination without abnormal findings: Secondary | ICD-10-CM | POA: Diagnosis not present

## 2020-04-13 DIAGNOSIS — R82998 Other abnormal findings in urine: Secondary | ICD-10-CM | POA: Diagnosis not present

## 2020-04-13 DIAGNOSIS — F329 Major depressive disorder, single episode, unspecified: Secondary | ICD-10-CM | POA: Diagnosis not present

## 2020-04-13 DIAGNOSIS — R338 Other retention of urine: Secondary | ICD-10-CM | POA: Diagnosis not present

## 2020-04-13 MED FILL — buPROPion HCL ER (XL) 150 M: 150 | 30 days supply | Qty: 30 | Fill #2

## 2020-05-05 MED FILL — FLUoxetine HCL 40 MG CAPS: 40 | 90 days supply | Qty: 90 | Fill #2

## 2020-05-17 MED FILL — buPROPion HCL ER (XL) 150 M: 150 | 30 days supply | Qty: 30 | Fill #3

## 2020-06-15 ENCOUNTER — Other Ambulatory Visit (HOSPITAL_COMMUNITY): Payer: Self-pay | Admitting: Endodontics

## 2020-06-15 MED FILL — traMADol HCL 50 MG TABS: 50 | 3 days supply | Qty: 10 | Fill #0

## 2020-06-15 MED FILL — IBUPROFEN 800 MG TABS: 800 | 4 days supply | Qty: 16 | Fill #0

## 2020-06-15 MED FILL — AMOXICILLIN 500 MG CAPSULE: 500 | 7 days supply | Qty: 25 | Fill #0

## 2020-06-16 MED FILL — buPROPion HCL ER (XL) 150 M: 150 | 30 days supply | Qty: 30 | Fill #4

## 2020-07-22 MED FILL — PROCTOZONE-HC 2.5 % CREA: 2.5 | 14 days supply | Qty: 30 | Fill #2

## 2020-07-22 MED FILL — buPROPion HCL ER (XL) 150 M: 150 | 30 days supply | Qty: 30 | Fill #5

## 2020-08-09 MED FILL — FLUoxetine HCL 40 MG CAPS: 40 | 90 days supply | Qty: 90 | Fill #3

## 2020-08-22 ENCOUNTER — Other Ambulatory Visit (HOSPITAL_COMMUNITY): Payer: Self-pay

## 2020-08-22 MED FILL — Bupropion HCl Tab ER 24HR 150 MG: ORAL | 30 days supply | Qty: 30 | Fill #0 | Status: AC

## 2020-09-19 ENCOUNTER — Other Ambulatory Visit (HOSPITAL_COMMUNITY): Payer: Self-pay

## 2020-09-19 MED FILL — Bupropion HCl Tab ER 24HR 150 MG: ORAL | 30 days supply | Qty: 30 | Fill #1 | Status: AC

## 2020-09-20 ENCOUNTER — Other Ambulatory Visit (HOSPITAL_COMMUNITY): Payer: Self-pay

## 2020-10-13 DIAGNOSIS — D751 Secondary polycythemia: Secondary | ICD-10-CM | POA: Diagnosis not present

## 2020-10-13 DIAGNOSIS — N401 Enlarged prostate with lower urinary tract symptoms: Secondary | ICD-10-CM | POA: Diagnosis not present

## 2020-10-13 DIAGNOSIS — F329 Major depressive disorder, single episode, unspecified: Secondary | ICD-10-CM | POA: Diagnosis not present

## 2020-10-13 DIAGNOSIS — R338 Other retention of urine: Secondary | ICD-10-CM | POA: Diagnosis not present

## 2020-10-13 DIAGNOSIS — Z87442 Personal history of urinary calculi: Secondary | ICD-10-CM | POA: Diagnosis not present

## 2020-10-13 DIAGNOSIS — E785 Hyperlipidemia, unspecified: Secondary | ICD-10-CM | POA: Diagnosis not present

## 2020-10-13 DIAGNOSIS — R635 Abnormal weight gain: Secondary | ICD-10-CM | POA: Diagnosis not present

## 2020-10-18 ENCOUNTER — Other Ambulatory Visit (HOSPITAL_COMMUNITY): Payer: Self-pay

## 2020-10-18 MED FILL — Bupropion HCl Tab ER 24HR 150 MG: ORAL | 30 days supply | Qty: 30 | Fill #2 | Status: AC

## 2020-10-20 ENCOUNTER — Other Ambulatory Visit (HOSPITAL_COMMUNITY): Payer: Self-pay

## 2020-11-01 ENCOUNTER — Other Ambulatory Visit (HOSPITAL_COMMUNITY): Payer: Self-pay

## 2020-11-02 ENCOUNTER — Other Ambulatory Visit (HOSPITAL_COMMUNITY): Payer: Self-pay

## 2020-11-09 ENCOUNTER — Other Ambulatory Visit (HOSPITAL_COMMUNITY): Payer: Self-pay
